# Patient Record
Sex: Female | Born: 1970 | Race: Black or African American | Hispanic: No | Marital: Single | Smoking: Never smoker
Health system: Southern US, Community
[De-identification: ages and names within clinical notes are randomized; demographics above are authoritative.]

## PROBLEM LIST (undated history)

## (undated) DIAGNOSIS — I10 Essential (primary) hypertension: Secondary | ICD-10-CM

---

## 2006-11-03 ENCOUNTER — Emergency Department (HOSPITAL_COMMUNITY): Admission: EM | Admit: 2006-11-03 | Discharge: 2006-11-03 | Payer: Self-pay | Admitting: Emergency Medicine

## 2008-10-17 ENCOUNTER — Ambulatory Visit: Payer: Self-pay | Admitting: Diagnostic Radiology

## 2008-10-17 ENCOUNTER — Emergency Department (HOSPITAL_BASED_OUTPATIENT_CLINIC_OR_DEPARTMENT_OTHER): Admission: EM | Admit: 2008-10-17 | Discharge: 2008-10-17 | Payer: Self-pay | Admitting: Emergency Medicine

## 2008-10-29 ENCOUNTER — Emergency Department (HOSPITAL_BASED_OUTPATIENT_CLINIC_OR_DEPARTMENT_OTHER): Admission: EM | Admit: 2008-10-29 | Discharge: 2008-10-29 | Payer: Self-pay | Admitting: Emergency Medicine

## 2008-10-29 ENCOUNTER — Ambulatory Visit: Payer: Self-pay | Admitting: Diagnostic Radiology

## 2009-01-28 ENCOUNTER — Ambulatory Visit: Payer: Self-pay | Admitting: Interventional Radiology

## 2009-01-28 ENCOUNTER — Emergency Department (HOSPITAL_BASED_OUTPATIENT_CLINIC_OR_DEPARTMENT_OTHER): Admission: EM | Admit: 2009-01-28 | Discharge: 2009-01-28 | Payer: Self-pay | Admitting: Emergency Medicine

## 2009-04-02 ENCOUNTER — Ambulatory Visit: Payer: Self-pay | Admitting: Diagnostic Radiology

## 2009-04-02 ENCOUNTER — Emergency Department (HOSPITAL_BASED_OUTPATIENT_CLINIC_OR_DEPARTMENT_OTHER): Admission: EM | Admit: 2009-04-02 | Discharge: 2009-04-02 | Payer: Self-pay | Admitting: Emergency Medicine

## 2010-03-29 ENCOUNTER — Emergency Department (HOSPITAL_BASED_OUTPATIENT_CLINIC_OR_DEPARTMENT_OTHER): Admission: EM | Admit: 2010-03-29 | Discharge: 2010-03-29 | Payer: Self-pay | Admitting: Emergency Medicine

## 2010-03-31 ENCOUNTER — Emergency Department (HOSPITAL_BASED_OUTPATIENT_CLINIC_OR_DEPARTMENT_OTHER): Admission: EM | Admit: 2010-03-31 | Discharge: 2010-03-31 | Payer: Self-pay | Admitting: Emergency Medicine

## 2010-12-31 LAB — URINALYSIS, ROUTINE W REFLEX MICROSCOPIC
Glucose, UA: NEGATIVE mg/dL
Ketones, ur: NEGATIVE mg/dL
Nitrite: NEGATIVE
Protein, ur: NEGATIVE mg/dL
Specific Gravity, Urine: 1.018 (ref 1.005–1.030)
Urobilinogen, UA: 0.2 mg/dL (ref 0.0–1.0)
pH: 6 (ref 5.0–8.0)

## 2010-12-31 LAB — URINE MICROSCOPIC-ADD ON

## 2011-01-06 LAB — RAPID STREP SCREEN (MED CTR MEBANE ONLY): Streptococcus, Group A Screen (Direct): NEGATIVE

## 2011-02-08 IMAGING — CR DG LUMBAR SPINE COMPLETE 4+V
5 series · 5 of 5 positions shown · non-contrast
Comparison: None

CLINICAL DATA: Right hip and low back pain.

LUMBAR SPINE - COMPLETE 4+ VIEW

[t l-spine a.p.]
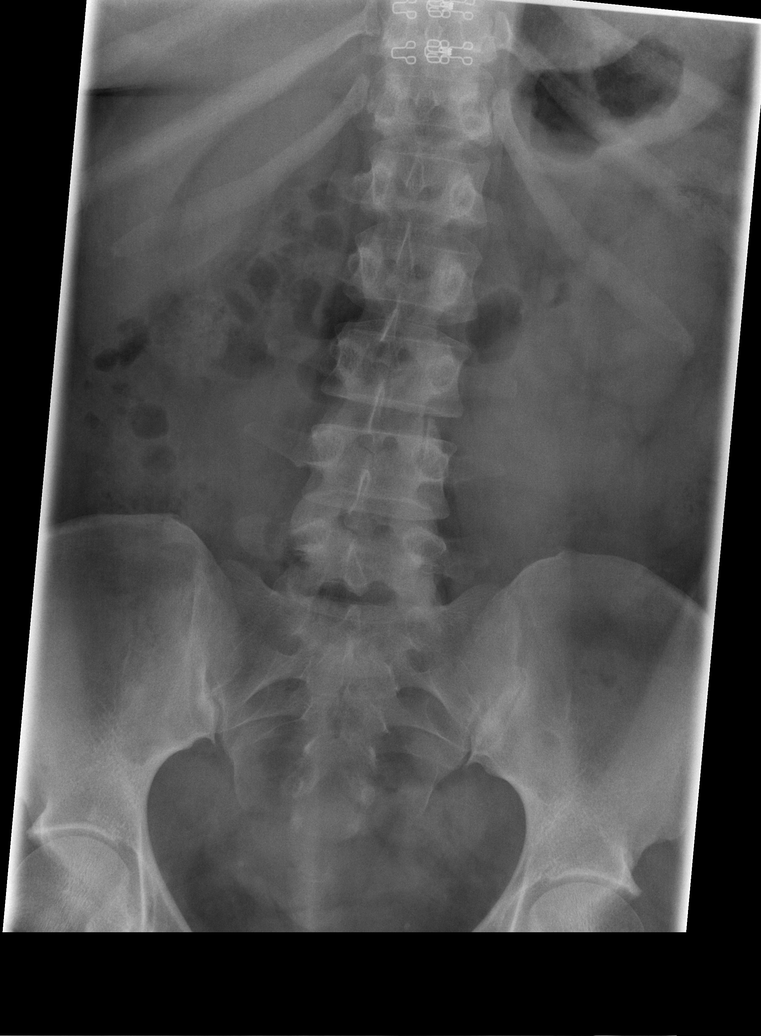

[t l-spine oblique exposure (1 of 2)]
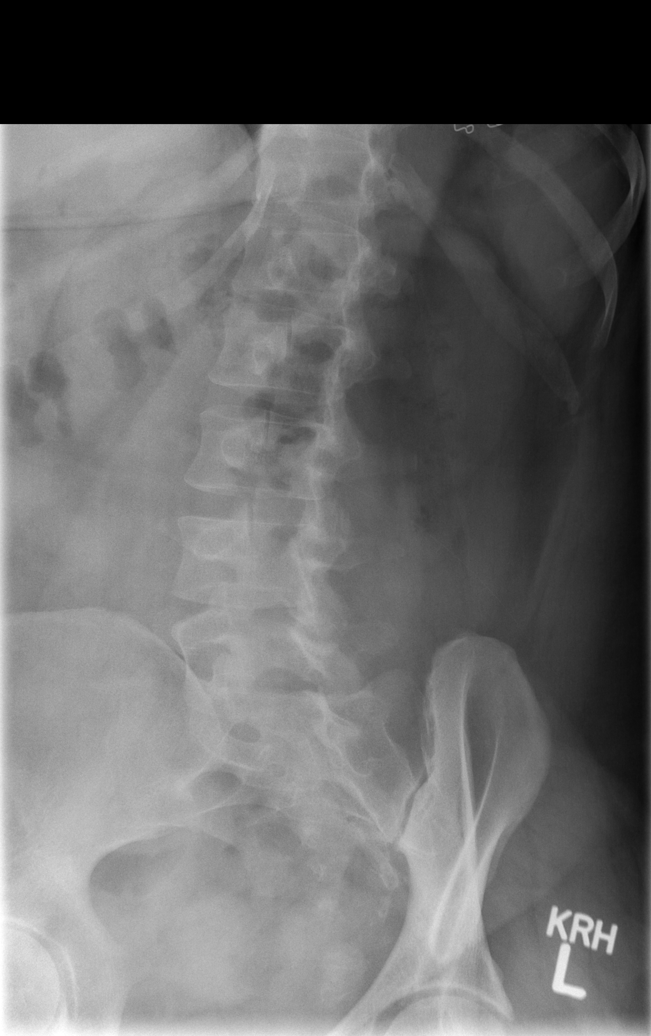

[t l-spine oblique exposure (2 of 2)]
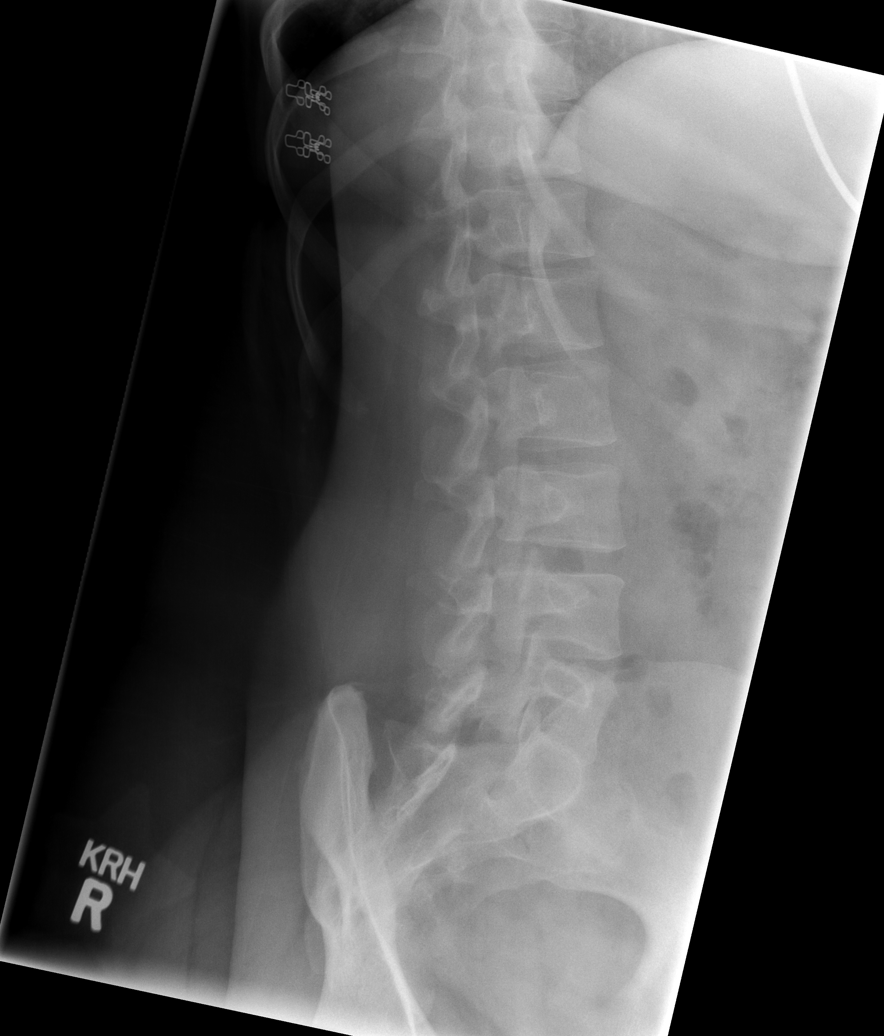

[t l-spine lat]
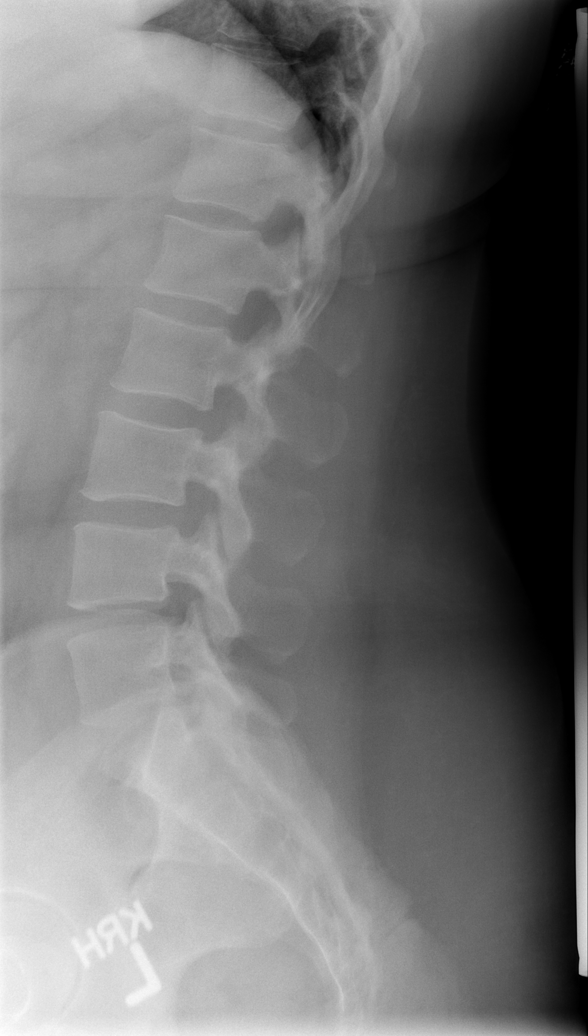

[t l-spine l5-s1 spot]
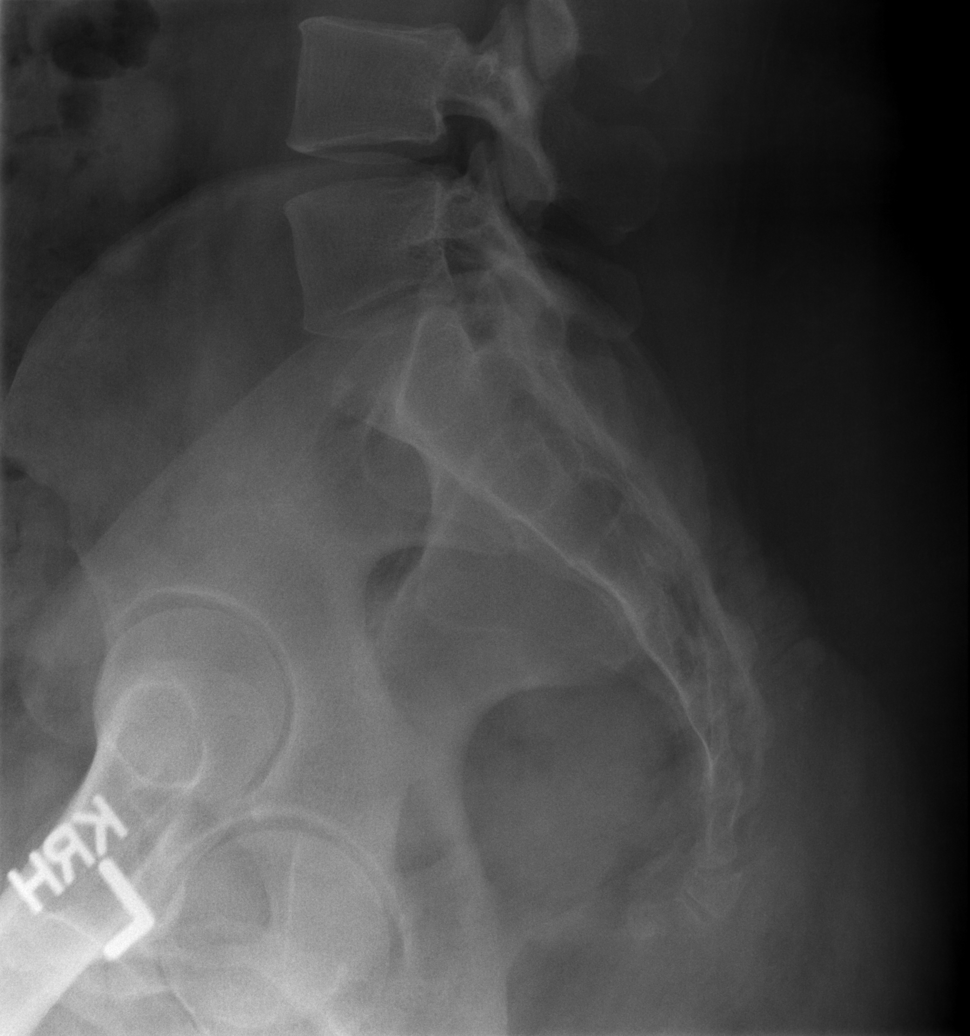

[5 of 5 positions shown; findings below may reference images not displayed]

FINDINGS: Lumbar vertebral bodies are normal aligned.  There is no
evidence of fracture, subluxation or listhesis.  No significant
disc disease is present.  Facets show normal alignment.  No bony
lesions are detected.
IMPRESSION: Normal lumbar spine.

## 2011-09-17 DIAGNOSIS — I10 Essential (primary) hypertension: Secondary | ICD-10-CM | POA: Insufficient documentation

## 2011-09-17 DIAGNOSIS — J069 Acute upper respiratory infection, unspecified: Secondary | ICD-10-CM | POA: Insufficient documentation

## 2011-09-18 ENCOUNTER — Emergency Department (HOSPITAL_BASED_OUTPATIENT_CLINIC_OR_DEPARTMENT_OTHER)
Admission: EM | Admit: 2011-09-18 | Discharge: 2011-09-18 | Disposition: A | Discharge status: Home / Self Care | Payer: BC Managed Care – PPO | Attending: Emergency Medicine | Admitting: Emergency Medicine

## 2011-09-18 ENCOUNTER — Encounter: Payer: Self-pay | Admitting: *Deleted

## 2011-09-18 DIAGNOSIS — J069 Acute upper respiratory infection, unspecified: Secondary | ICD-10-CM

## 2011-09-18 HISTORY — DX: Essential (primary) hypertension: I10

## 2011-09-18 MED ORDER — DEXAMETHASONE SODIUM PHOSPHATE 10 MG/ML IJ SOLN: 10.0000 mg | Freq: Once | INTRAMUSCULAR | Status: DC

## 2011-09-18 MED ORDER — FEXOFENADINE HCL 60 MG PO TABS: 60.0000 mg | ORAL_TABLET | Freq: Two times a day (BID) | ORAL | Status: AC

## 2011-09-18 NOTE — ED Notes (Signed)
Pt left prior to receiving meds or d/c papers and rx. MD aware.

## 2011-09-18 NOTE — ED Provider Notes (Signed)
History     CSN: [id number]  Arrival date & time 09/17/11  2356   First MD Initiated Contact with Patient 09/18/11 0058      Chief Complaint  Patient presents with  . Sore Throat    (Consider location/radiation/quality/duration/timing/severity/associated sxs/prior treatment) HPI Comments: Pt with 2 week hx of sinus pain/congestion/drainage, sore throat.  No fevers.  Was recently tx for sinus infection with amoxicillin.  Pt says that she no longer has the pain in her face, but still with drainage, ear pain and sore throat.  Patient is a 40 y.o. female presenting with pharyngitis. The history is provided by the patient.  Sore Throat This is a new problem. The current episode started more than 1 week ago. The problem occurs constantly. The problem has not changed since onset.Pertinent negatives include no chest pain, no abdominal pain, no headaches and no shortness of breath. The symptoms are relieved by nothing. Treatments tried: cold meds. The treatment provided no relief.    Past Medical History  Diagnosis Date  . Hypertension     Past Surgical History  Procedure Date  . Cesarean section     History reviewed. No pertinent family history.  History  Substance Use Topics  . Smoking status: Never Smoker   . Smokeless tobacco: Not on file  . Alcohol Use: No    OB History    Grav Para Term Preterm Abortions TAB SAB Ect Mult Living                  Review of Systems  Constitutional: Negative for fever, chills, diaphoresis and fatigue.  HENT: Positive for congestion, sore throat and postnasal drip. Negative for rhinorrhea and sneezing.   Eyes: Negative.   Respiratory: Negative for cough, chest tightness and shortness of breath.   Cardiovascular: Negative for chest pain and leg swelling.  Gastrointestinal: Negative for nausea, vomiting, abdominal pain, diarrhea and blood in stool.  Genitourinary: Negative for frequency, hematuria, flank pain and difficulty urinating.    Musculoskeletal: Negative for back pain and arthralgias.  Skin: Negative for rash.  Neurological: Negative for dizziness, speech difficulty, weakness, numbness and headaches.    Allergies  Review of patient's allergies indicates no known allergies.  Home Medications   Current Outpatient Rx  Name Route Sig Dispense Refill  . LISINOPRIL 10 MG PO TABS Oral Take 10 mg by mouth daily.      Marland Kitchen FEXOFENADINE HCL 60 MG PO TABS Oral Take 1 tablet (60 mg total) by mouth 2 (two) times daily. 30 tablet 0    BP 151/96  Pulse 85  Temp(Src) 99 F (37.2 C) (Oral)  Resp 16  Ht 4' 11.5" (1.511 m)  Wt 216 lb (97.977 kg)  BMI 42.90 kg/m2  SpO2 100%  LMP 08/16/2011  Physical Exam  Constitutional: She is oriented to person, place, and time. She appears well-developed and well-nourished.  HENT:  Head: Normocephalic and atraumatic.  Right Ear: External ear normal.  Left Ear: External ear normal.  Nose: Nose normal.  Mouth/Throat: Oropharynx is clear and moist.  Eyes: Pupils are equal, round, and reactive to light.  Neck: Normal range of motion. Neck supple.  Cardiovascular: Normal rate, regular rhythm and normal heart sounds.   Pulmonary/Chest: Effort normal and breath sounds normal. No respiratory distress. She has no wheezes. She has no rales. She exhibits no tenderness.  Abdominal: Soft. Bowel sounds are normal. There is no tenderness. There is no rebound and no guarding.  Musculoskeletal: Normal range of motion.  She exhibits no edema.  Lymphadenopathy:    She has no cervical adenopathy.  Neurological: She is alert and oriented to person, place, and time.  Skin: Skin is warm and dry. No rash noted.  Psychiatric: She has a normal mood and affect.    ED Course  Procedures (including critical care time)  Labs Reviewed - No data to display No results found.   1. URI (upper respiratory infection)       MDM  No pharyngeal erythema/exudates to suggest infection.  No pain over sinuses  to suggest sinusitis        Rolan Bucco, MD 09/18/11 0425

## 2011-09-18 NOTE — ED Notes (Signed)
Pt c/o sore throat x 1 month seen by urgent care placed on amoxil w/o relief

## 2014-11-16 ENCOUNTER — Emergency Department (HOSPITAL_BASED_OUTPATIENT_CLINIC_OR_DEPARTMENT_OTHER)
Admission: EM | Admit: 2014-11-16 | Discharge: 2014-11-17 | Disposition: A | Discharge status: Home / Self Care | Payer: BC Managed Care – PPO | Attending: Emergency Medicine | Admitting: Emergency Medicine

## 2014-11-16 ENCOUNTER — Encounter (HOSPITAL_BASED_OUTPATIENT_CLINIC_OR_DEPARTMENT_OTHER): Payer: Self-pay | Admitting: Emergency Medicine

## 2014-11-16 DIAGNOSIS — H938X1 Other specified disorders of right ear: Secondary | ICD-10-CM | POA: Insufficient documentation

## 2014-11-16 DIAGNOSIS — R591 Generalized enlarged lymph nodes: Secondary | ICD-10-CM | POA: Diagnosis not present

## 2014-11-16 DIAGNOSIS — Z79899 Other long term (current) drug therapy: Secondary | ICD-10-CM | POA: Insufficient documentation

## 2014-11-16 DIAGNOSIS — I10 Essential (primary) hypertension: Secondary | ICD-10-CM | POA: Diagnosis not present

## 2014-11-16 DIAGNOSIS — R0981 Nasal congestion: Secondary | ICD-10-CM | POA: Diagnosis not present

## 2014-11-16 DIAGNOSIS — R51 Headache: Secondary | ICD-10-CM | POA: Diagnosis not present

## 2014-11-16 DIAGNOSIS — J3489 Other specified disorders of nose and nasal sinuses: Secondary | ICD-10-CM | POA: Diagnosis not present

## 2014-11-16 DIAGNOSIS — H9202 Otalgia, left ear: Secondary | ICD-10-CM | POA: Diagnosis not present

## 2014-11-16 NOTE — ED Notes (Signed)
Patient states that she was seen by her MD yesterday and treated. The patient reports that she was told that she had fluid behind her ear and when she blew her nose tonight it felt like her left ear had a bubble break and she now has more pain.

## 2014-11-17 MED ORDER — IBUPROFEN 400 MG PO TABS
600.0000 mg | ORAL_TABLET | Freq: Once | ORAL | Status: AC
  Administered 2014-11-17: 600 mg via ORAL
  Filled 2014-11-17 (×2): qty 1

## 2014-11-17 NOTE — Discharge Instructions (Signed)
Return to the ER if there is any drainage from the ear, neck pain or stiffness, sevee headaches, high fevers, seizures, confusion. Otherwise, take ibuprofen every 6 hours, finish the course of antibiotics. See your doctor in 1 week if not better.   Otalgia The most common reason for this in children is an infection of the middle ear. Pain from the middle ear is usually caused by a build-up of fluid and pressure behind the eardrum. Pain from an earache can be sharp, dull, or burning. The pain may be temporary or constant. The middle ear is connected to the nasal passages by a short narrow tube called the Eustachian tube. The Eustachian tube allows fluid to drain out of the middle ear, and helps keep the pressure in your ear equalized. CAUSES  A cold or allergy can block the Eustachian tube with inflammation and the build-up of secretions. This is especially likely in small children, because their Eustachian tube is shorter and more horizontal. When the Eustachian tube closes, the normal flow of fluid from the middle ear is stopped. Fluid can accumulate and cause stuffiness, pain, hearing loss, and an ear infection if germs start growing in this area. SYMPTOMS  The symptoms of an ear infection may include fever, ear pain, fussiness, increased crying, and irritability. Many children will have temporary and minor hearing loss during and right after an ear infection. Permanent hearing loss is rare, but the risk increases the more infections a child has. Other causes of ear pain include retained water in the outer ear canal from swimming and bathing. Ear pain in adults is less likely to be from an ear infection. Ear pain may be referred from other locations. Referred pain may be from the joint between your jaw and the skull. It may also come from a tooth problem or problems in the neck. Other causes of ear pain include:  A foreign body in the ear.  Outer ear infection.  Sinus infections.  Impacted ear  wax.  Ear injury.  Arthritis of the jaw or TMJ problems.  Middle ear infection.  Tooth infections.  Sore throat with pain to the ears. DIAGNOSIS  Your caregiver can usually make the diagnosis by examining you. Sometimes other special studies, including x-rays and lab work may be necessary. TREATMENT   If antibiotics were prescribed, use them as directed and finish them even if you or your child's symptoms seem to be improved.  Sometimes PE tubes are needed in children. These are little plastic tubes which are put into the eardrum during a simple surgical procedure. They allow fluid to drain easier and allow the pressure in the middle ear to equalize. This helps relieve the ear pain caused by pressure changes. HOME CARE INSTRUCTIONS   Only take over-the-counter or prescription medicines for pain, discomfort, or fever as directed by your caregiver. DO NOT GIVE CHILDREN ASPIRIN because of the association of Reye's Syndrome in children taking aspirin.  Use a cold pack applied to the outer ear for 15-20 minutes, 03-04 times per day or as needed may reduce pain. Do not apply ice directly to the skin. You may cause frost bite.  Over-the-counter ear drops used as directed may be effective. Your caregiver may sometimes prescribe ear drops.  Resting in an upright position may help reduce pressure in the middle ear and relieve pain.  Ear pain caused by rapidly descending from high altitudes can be relieved by swallowing or chewing gum. Allowing infants to suck on a bottle during  airplane travel can help.  Do not smoke in the house or near children. If you are unable to quit smoking, smoke outside.  Control allergies. SEEK IMMEDIATE MEDICAL CARE IF:   You or your child are becoming sicker.  Pain or fever relief is not obtained with medicine.  You or your child's symptoms (pain, fever, or irritability) do not improve within 24 to 48 hours or as instructed.  Severe pain suddenly stops  hurting. This may indicate a ruptured eardrum.  You or your children develop new problems such as severe headaches, stiff neck, difficulty swallowing, or swelling of the face or around the ear. Document Released: 04/25/2004 Document Revised: 12/01/2011 Document Reviewed: 08/30/2008 [facility] Patient Information 2015 [place], [place]. This information is not intended to replace advice given to you by your health care provider. Make sure you discuss any questions you have with your health care provider.

## 2014-11-17 NOTE — ED Provider Notes (Signed)
CSN: [id number]     Arrival date & time 11/16/14  2128 History   First MD Initiated Contact with Patient 11/16/14 2348     Chief Complaint  Patient presents with  . Otalgia     (Consider location/radiation/quality/duration/timing/severity/associated sxs/prior Treatment) HPI Comments: Pt comes in with cc of earaches. Pt is healthy, immunocompetent woman. She reports that her symptoms started on Monday, and she saw her pcp yday and was started on a Zpack. Pt was blowing her nose due to congestion, and felt a pop in her L ear with increased pain. No ringing in the ear or hearing loss. There is no drainage. No neck pain, stiffness, severe headches, confusion, fevers.  Patient is a 44 y.o. female presenting with ear pain. The history is provided by the patient.  Otalgia Associated symptoms: congestion and headaches   Associated symptoms: no abdominal pain, no fever, no neck pain and no vomiting     Past Medical History  Diagnosis Date  . Hypertension    Past Surgical History  Procedure Laterality Date  . Cesarean section     History reviewed. No pertinent family history. History  Substance Use Topics  . Smoking status: Never Smoker   . Smokeless tobacco: Not on file  . Alcohol Use: No   OB History    No data available     Review of Systems  Constitutional: Negative for fever and activity change.  HENT: Positive for congestion, ear pain and sinus pressure.   Respiratory: Negative for shortness of breath.   Cardiovascular: Negative for chest pain.  Gastrointestinal: Negative for nausea, vomiting and abdominal pain.  Genitourinary: Negative for dysuria.  Musculoskeletal: Negative for neck pain.  Neurological: Positive for headaches.      Allergies  Review of patient's allergies indicates no known allergies.  Home Medications   Prior to Admission medications   Medication Sig Start Date End Date Taking? Authorizing Provider  fexofenadine (ALLEGRA) 60 MG tablet Take 1  tablet (60 mg total) by mouth 2 (two) times daily. 09/18/11 09/17/12  Rolan Bucco, MD  lisinopril (PRINIVIL,ZESTRIL) 10 MG tablet Take 10 mg by mouth daily.      Historical Provider, MD   BP 140/80 mmHg  Pulse 89  Temp(Src) 98.2 F (36.8 C) (Oral)  Resp 20  Ht  (1.499 m)  Wt 224 lb (101.606 kg)  BMI 45.22 kg/m2  SpO2 98%  LMP 11/15/2014 Physical Exam  Constitutional: She is oriented to person, place, and time. She appears well-developed and well-nourished.  HENT:  Head: Normocephalic and atraumatic.  Mild post auricular lymphadenopathy, Right with mild effusion behind the TM. There is + light reflex and TM is intact. There is no mastoid tenderness, no trismus.   Eyes: EOM are normal. Pupils are equal, round, and reactive to light.  Neck: Normal range of motion. Neck supple.  Cardiovascular: Normal rate, regular rhythm and normal heart sounds.   No murmur heard. Pulmonary/Chest: Effort normal. No respiratory distress.  Abdominal: Soft. She exhibits no distension. There is no tenderness. There is no rebound and no guarding.  Lymphadenopathy:    She has no cervical adenopathy.  Neurological: She is alert and oriented to person, place, and time.  Skin: Skin is warm and dry.  Nursing note and vitals reviewed.   ED Course  Procedures (including critical care time) Labs Review Labs Reviewed - No data to display  Imaging Review No results found.   EKG Interpretation None      MDM  Final diagnoses:  Otalgia of left ear    Pt with ear ache. She had an event of pop in her ears with increased pain. Already on antibiotics. Pt's has no hard signs of deep infection and the TM is intact. Strict return precautions discussed, but patient is non toxic and stable for dc.    Derwood KaplanAnkit Hillarie Harrigan, MD 11/17/14 0025

## 2014-11-17 NOTE — ED Notes (Signed)
Care assumed at time of d/c. Pt seen by EDP prior to RN assessment, see MD notes, orders received and intitated. Alert, NAD, calm, interactive, resps e/u, speaking in clear complete sentences. C/o L earache. (denies: nvd, fever, drainage, dizziness, hearing changes, cold sx, congestion or other sx).

## 2016-01-20 ENCOUNTER — Encounter (HOSPITAL_BASED_OUTPATIENT_CLINIC_OR_DEPARTMENT_OTHER): Payer: Self-pay | Admitting: *Deleted

## 2016-01-20 ENCOUNTER — Emergency Department (HOSPITAL_BASED_OUTPATIENT_CLINIC_OR_DEPARTMENT_OTHER)
Admission: EM | Admit: 2016-01-20 | Discharge: 2016-01-20 | Disposition: A | Discharge status: Home / Self Care | Payer: BC Managed Care – PPO | Attending: Emergency Medicine | Admitting: Emergency Medicine

## 2016-01-20 DIAGNOSIS — H109 Unspecified conjunctivitis: Secondary | ICD-10-CM | POA: Insufficient documentation

## 2016-01-20 DIAGNOSIS — Z79899 Other long term (current) drug therapy: Secondary | ICD-10-CM | POA: Insufficient documentation

## 2016-01-20 DIAGNOSIS — I10 Essential (primary) hypertension: Secondary | ICD-10-CM | POA: Insufficient documentation

## 2016-01-20 MED ORDER — ERYTHROMYCIN 5 MG/GM OP OINT: TOPICAL_OINTMENT | OPHTHALMIC | Status: AC

## 2016-01-20 NOTE — ED Notes (Signed)
Pt reports bilateral eye redness, burning and drainage. 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

## 2016-01-20 NOTE — Discharge Instructions (Signed)
Bacterial Conjunctivitis °Bacterial conjunctivitis, commonly called pink eye, is an inflammation of the clear membrane that covers the white part of the eye (conjunctiva). The inflammation can also happen on the underside of the eyelids. The blood vessels in the conjunctiva become inflamed, causing the eye to become red or pink. Bacterial conjunctivitis may spread easily from one eye to another and from person to person (contagious).  °CAUSES  °Bacterial conjunctivitis is caused by bacteria. The bacteria may come from your own skin, your upper respiratory tract, or from someone else with bacterial conjunctivitis. °SYMPTOMS  °The normally white color of the eye or the underside of the eyelid is usually pink or red. The pink eye is usually associated with irritation, tearing, and some sensitivity to light. Bacterial conjunctivitis is often associated with a thick, yellowish discharge from the eye. The discharge may turn into a crust on the eyelids overnight, which causes your eyelids to stick together. If a discharge is present, there may also be some blurred vision in the affected eye. °DIAGNOSIS  °Bacterial conjunctivitis is diagnosed by your caregiver through an eye exam and the symptoms that you report. Your caregiver looks for changes in the surface tissues of your eyes, which may point to the specific type of conjunctivitis. A sample of any discharge may be collected on a cotton-tip swab if you have a severe case of conjunctivitis, if your cornea is affected, or if you keep getting repeat infections that do not respond to treatment. The sample will be sent to a lab to see if the inflammation is caused by a bacterial infection and to see if the infection will respond to antibiotic medicines. °TREATMENT  °1. Bacterial conjunctivitis is treated with antibiotics. Antibiotic eyedrops are most often used. However, antibiotic ointments are also available. Antibiotics pills are sometimes used. Artificial tears or eye  washes may ease discomfort. °HOME CARE INSTRUCTIONS  °1. To ease discomfort, apply a cool, clean washcloth to your eye for 10-20 minutes, 3-4 times a day. °2. Gently wipe away any drainage from your eye with a warm, wet washcloth or a cotton ball. °3. Wash your hands often with soap and water. Use paper towels to dry your hands. °4. Do not share towels or washcloths. This may spread the infection. °5. Change or wash your pillowcase every day. °6. You should not use eye makeup until the infection is gone. °7. Do not operate machinery or drive if your vision is blurred. °8. Stop using contact lenses. Ask your caregiver how to sterilize or replace your contacts before using them again. This depends on the type of contact lenses that you use. °9. When applying medicine to the infected eye, do not touch the edge of your eyelid with the eyedrop bottle or ointment tube. °SEEK IMMEDIATE MEDICAL CARE IF:  °· Your infection has not improved within 3 days after beginning treatment. °· You had yellow discharge from your eye and it returns. °· You have increased eye pain. °· Your eye redness is spreading. °· Your vision becomes blurred. °· You have a fever or persistent symptoms for more than 2-3 days. °· You have a fever and your symptoms suddenly get worse. °· You have facial pain, redness, or swelling. °MAKE SURE YOU:  °· Understand these instructions. °· Will watch your condition. °· Will get help right away if you are not doing well or get worse. °  °This information is not intended to replace advice given to you by your health care provider. Make sure you   discuss any questions you have with your health care provider. °  °Document Released: 09/08/2005 Document Revised: 09/29/2014 Document Reviewed: 02/09/2012 °Elsevier Interactive Patient Education ©2016 [employer] ° °How to Use Eye Drops and Eye Ointments °HOW TO APPLY EYE DROPS °Follow these steps when applying eye drops: °2. Wash your hands. °3. Tilt your head  back. °4. Put a finger under your eye and use it to gently pull your lower lid downward. Keep that finger in place. °5. Using your other hand, hold the dropper between your thumb and index finger. °6. Position the dropper just over the edge of the lower lid. Hold it as close to your eye as you can without touching the dropper to your eye. °7. Steady your hand. One way to do this is to lean your index finger against your brow. °8. Look up. °9. Slowly and gently squeeze one drop of medicine into your eye. °10. Close your eye. °11. Place a finger between your lower eyelid and your nose. Press gently for 2 minutes. This increases the amount of time that the medicine is exposed to the eye. It also reduces side effects that can develop if the drop gets into the bloodstream through the nose. °HOW TO APPLY EYE OINTMENTS °Follow these steps when applying eye ointments: °10. Wash your hands. °11. Put a finger under your eye and use it to gently pull your lower lid downward. Keep that finger in place. °12. Using your other hand, place the tip of the tube between your thumb and index finger with the remaining fingers braced against your cheek or nose. °13. Hold the tube just over the edge of your lower lid without touching the tube to your lid or eyeball. °14. Look up. °15. Line the inner part of your lower lid with ointment. °16. Gently pull up on your upper lid and look down. This will force the ointment to spread over the surface of the eye. °17. Release the upper lid. °18. If you can, close your eyes for 1-2 minutes. °Do not rub your eyes. If you applied the ointment correctly, your vision will be blurry for a few minutes. This is normal. °ADDITIONAL INFORMATION °· Make sure to use the eye drops or ointment as told by your health care provider. °· If you have been told to use both eye drops and an eye ointment, apply the eye drops first, then wait 3-4 minutes before you apply the ointment. °· Try not to touch the tip of the  dropper or tube to your eye. A dropper or tube that has touched the eye can become contaminated. °  °This information is not intended to replace advice given to you by your health care provider. Make sure you discuss any questions you have with your health care provider. °  °Document Released: 12/15/2000 Document Revised: 01/23/2015 Document Reviewed: 09/04/2014 °Elsevier Interactive Patient Education ©2016 Elsevier Inc. ° °

## 2016-01-20 NOTE — ED Provider Notes (Signed)
CSN: [id number]     Arrival date & time 01/20/16  1952 History   First MD Initiated Contact with Patient 01/20/16 2059     Chief Complaint  Patient presents with  . Eye Problem     (Consider location/radiation/quality/duration/timing/severity/associated sxs/prior Treatment) HPI Rita Gray is a 45 y.o. female here for evaluation of eye problem. 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 She reports onset of bilateral eye redness and crusting on Friday morning. She denies any fevers, chills, eye pain or vision changes. She is tried homeopathic medications without relief of her symptoms. No other modifying factors. No corrective lens use.  Past Medical History  Diagnosis Date  . Hypertension    Past Surgical History  Procedure Laterality Date  . Cesarean section     No family history on file. Social History  Substance Use Topics  . Smoking status: Never Smoker   . Smokeless tobacco: None  . Alcohol Use: No   OB History    No data available     Review of Systems A 10 point review of systems was completed and was negative except for pertinent positives and negatives as mentioned in the history of present illness     Allergies  Review of patient's allergies indicates no known allergies.  Home Medications   Prior to Admission medications   Medication Sig Start Date End Date Taking? Authorizing Provider  erythromycin ophthalmic ointment Place a 1/2 inch ribbon of ointment into the lower eyelid. 01/20/16   Joycie PeekBenjamin Lakysha Kossman, PA-C  fexofenadine (ALLEGRA) 60 MG tablet Take 1 tablet (60 mg total) by mouth 2 (two) times daily. 09/18/11 09/17/12  Rolan BuccoMelanie Belfi, MD  lisinopril (PRINIVIL,ZESTRIL) 10 MG tablet Take 10 mg by mouth daily.      Historical Provider, MD   BP 133/97 mmHg  Pulse 79  Temp(Src) 98.7 F (37.1 C) (Oral)  Resp 20  Ht 4\' 11"  (1.499 m)  Wt 101.606 kg  BMI 45.22 kg/m2  SpO2 100%  LMP  01/13/2016 Physical Exam  Constitutional:  Awake, alert, nontoxic appearance.  HENT:  Head: Atraumatic.  Eyes: Right eye exhibits no discharge. Left eye exhibits no discharge.  Bilateral conjunctivitis with drainage. Pupils are equal round reactive to light. Extraocular movements intact without nystagmus.  Neck: Neck supple.  Pulmonary/Chest: Effort normal. She exhibits no tenderness.  Abdominal: Soft. There is no tenderness. There is no rebound.  Musculoskeletal: She exhibits no tenderness.  Baseline ROM, no obvious new focal weakness.  Neurological:  Mental status and motor strength appears baseline for patient and situation.  Skin: No rash noted.  Psychiatric: She has a normal mood and affect.  Nursing note and vitals reviewed.   ED Course  Procedures (including critical care time) Labs Review Labs Reviewed - No data to display  Imaging Review No results found. I have personally reviewed and evaluated these images and lab results as part of my medical decision-making.   EKG Interpretation None       Visual Acuity  Right Eye Distance: 20/50 Left Eye Distance: 20/40 Bilateral Distance: 20/40  Right Eye Near:   Left Eye Near:    Bilateral Near:     MDM  Rita Gray is a 45 y.o. female who presents for evaluation of conjunctivitis. Patient recently exposed to children with pinkeye. Low suspicion for HSV infection, glaucoma, iritis or other emergent ophthalmologic pathology. Plan to DC with antibiotics and follow up with PCP next week. She  verbalizes understanding and agrees with plan is for discharge. Final diagnoses:  Bilateral conjunctivitis        Joycie Peek, PA-C 01/21/16 0104  Pricilla Loveless, MD 01/25/16 [phone]
